# Patient Record
Sex: Female | Born: 1950 | Race: Black or African American | Hispanic: No | Marital: Single | Smoking: Never smoker
Health system: Southern US, Community
[De-identification: ages and names within clinical notes are randomized; demographics above are authoritative.]

## PROBLEM LIST (undated history)

## (undated) DIAGNOSIS — M1990 Unspecified osteoarthritis, unspecified site: Secondary | ICD-10-CM

## (undated) DIAGNOSIS — I10 Essential (primary) hypertension: Secondary | ICD-10-CM

## (undated) DIAGNOSIS — J45909 Unspecified asthma, uncomplicated: Secondary | ICD-10-CM

## (undated) HISTORY — PX: REPLACEMENT TOTAL KNEE: SUR1224

## (undated) HISTORY — PX: ABDOMINAL HYSTERECTOMY: SHX81

---

## 2001-03-08 ENCOUNTER — Inpatient Hospital Stay (HOSPITAL_COMMUNITY): Admission: EM | Admit: 2001-03-08 | Discharge: 2001-03-09 | Payer: Self-pay | Admitting: Internal Medicine

## 2001-03-08 ENCOUNTER — Encounter: Payer: Self-pay | Admitting: Internal Medicine

## 2002-06-18 ENCOUNTER — Other Ambulatory Visit: Admission: RE | Admit: 2002-06-18 | Discharge: 2002-06-18 | Payer: Self-pay | Admitting: *Deleted

## 2002-06-25 ENCOUNTER — Encounter: Admission: RE | Admit: 2002-06-25 | Discharge: 2002-07-22 | Payer: Self-pay | Admitting: Internal Medicine

## 2005-01-29 ENCOUNTER — Ambulatory Visit (HOSPITAL_BASED_OUTPATIENT_CLINIC_OR_DEPARTMENT_OTHER): Admission: RE | Admit: 2005-01-29 | Discharge: 2005-01-29 | Payer: Self-pay | Admitting: Cardiology

## 2005-01-30 ENCOUNTER — Emergency Department (HOSPITAL_COMMUNITY): Admission: EM | Admit: 2005-01-30 | Discharge: 2005-01-30 | Payer: Self-pay | Admitting: Emergency Medicine

## 2005-02-05 ENCOUNTER — Ambulatory Visit: Payer: Self-pay | Admitting: Internal Medicine

## 2008-02-05 ENCOUNTER — Other Ambulatory Visit: Admission: RE | Admit: 2008-02-05 | Discharge: 2008-02-05 | Payer: Self-pay | Admitting: Obstetrics and Gynecology

## 2008-02-05 ENCOUNTER — Ambulatory Visit: Payer: Self-pay | Admitting: Obstetrics & Gynecology

## 2008-02-28 ENCOUNTER — Ambulatory Visit: Payer: Self-pay | Admitting: Gynecology

## 2010-05-12 ENCOUNTER — Emergency Department (HOSPITAL_BASED_OUTPATIENT_CLINIC_OR_DEPARTMENT_OTHER)
Admission: EM | Admit: 2010-05-12 | Discharge: 2010-05-12 | Payer: Self-pay | Source: Home / Self Care | Admitting: Emergency Medicine

## 2010-05-12 ENCOUNTER — Ambulatory Visit: Payer: Self-pay | Admitting: Diagnostic Radiology

## 2010-07-08 ENCOUNTER — Encounter (HOSPITAL_COMMUNITY)
Admission: RE | Admit: 2010-07-08 | Discharge: 2010-10-06 | Payer: Self-pay | Source: Home / Self Care | Attending: Cardiology | Admitting: Cardiology

## 2010-10-30 ENCOUNTER — Encounter: Payer: Self-pay | Admitting: Cardiology

## 2010-12-23 LAB — COMPREHENSIVE METABOLIC PANEL
BUN: 20 mg/dL (ref 6–23)
CO2: 29 mEq/L (ref 19–32)
Calcium: 9.4 mg/dL (ref 8.4–10.5)
Creatinine, Ser: 1 mg/dL (ref 0.4–1.2)
GFR calc non Af Amer: 57 mL/min — ABNORMAL LOW (ref >60)
Glucose, Bld: 102 mg/dL — ABNORMAL HIGH (ref 70–99)
Total Protein: 8.7 g/dL — ABNORMAL HIGH (ref 6.0–8.3)

## 2010-12-23 LAB — URINALYSIS, ROUTINE W REFLEX MICROSCOPIC
Bilirubin Urine: NEGATIVE
Glucose, UA: NEGATIVE mg/dL
Ketones, ur: NEGATIVE mg/dL
Protein, ur: NEGATIVE mg/dL
pH: 6.5 (ref 5.0–8.0)

## 2010-12-23 LAB — DIFFERENTIAL
Eosinophils Absolute: 0.3 10*3/uL (ref 0.0–0.7)
Lymphocytes Relative: 49 % — ABNORMAL HIGH (ref 12–46)
Lymphs Abs: 2.8 10*3/uL (ref 0.7–4.0)
Monocytes Relative: 6 % (ref 3–12)
Neutro Abs: 2.2 10*3/uL (ref 1.7–7.7)
Neutrophils Relative %: 39 % — ABNORMAL LOW (ref 43–77)

## 2010-12-23 LAB — POCT CARDIAC MARKERS
CKMB, poc: 1.9 ng/mL (ref 1.0–8.0)
CKMB, poc: 3.6 ng/mL (ref 1.0–8.0)
Myoglobin, poc: 86.4 ng/mL (ref 12–200)

## 2010-12-23 LAB — CBC
HCT: 39 % (ref 36.0–46.0)
Hemoglobin: 13 g/dL (ref 12.0–15.0)
MCH: 32.2 pg (ref 26.0–34.0)
MCHC: 33.5 g/dL (ref 30.0–36.0)
RDW: 12.7 % (ref 11.5–15.5)

## 2010-12-23 LAB — URINE CULTURE: Culture  Setup Time: 201108050113

## 2011-02-21 NOTE — Group Therapy Note (Signed)
NAMEMarland Kitchen  AUTRY, DROEGE NO.:  [id number]   MEDICAL RECORD NO.:  [id number]          PATIENT TYPE:  WOC   LOCATION:  [facility]                   FACILITY:  [facility]   PHYSICIAN:  Ginger Carne, MD DATE OF BIRTH:  01-Aug-1951   DATE OF SERVICE:  02/28/2008                                  CLINIC NOTE   Alejandra Hughes returns today for results of endometrial biopsy because of  postmenopausal bleeding.  Biopsy results from February 05, 2008, reveals  scant benign columnar mucosa without neoplasia or hyperplasia.  The  patient at this time states that she does have low back pain with  occasional spotting.  Her last ultrasound report noted was on December 16, 2007, from [facility].  Unfortunately, it is  nonspecific as to the location of the largest fibroid measuring 5.4 cm  and also describes an enlarged ovary without any dimensions listed.  Her  primary care physician in [facility] is Dr. Madison Hickman.  The patient  is in agreement that she will contact Dr. Daphine Deutscher for a followup  ultrasound at [facility] and then proceed from there as far as  further management.  I explained to the patient that she may require a  hysteroscopy, dilation and curettage, and also depending on the location  of the fibroids may be responsible for some of her low back pain.  She  weighs 255 pounds and is 5 feet 4-1/2 inches, and certainly, her pain  may be completely unrelated to her leiomyoma.  The patient will follow  up with Dr. Daphine Deutscher in [facility].           ______________________________  Ginger Carne, MD     SHB/MEDQ  D:  02/28/2008  T:  02/29/2008  Job:  [id number]
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
## 2011-02-24 NOTE — Discharge Summary (Signed)
[facility]  Patient:    Alejandra Hughes                          MRN: [id number] Adm. Date:  [id number] Disc. Date: 03/09/01 Attending:  Dorena Cookey                           Discharge Summary  HISTORY OF PRESENT ILLNESS:  Alejandra Hughes is a 60 year old black female who presented at primary care office yesterday with jitteriness after eating, sweating, and some chest discomfort she described as midsternal sharp pain.  She said that it occurred while she was vacuuming.  She took an aspirin.  She felt better but had two episodes of similar chest discomfort lasting 10-15 minutes on the day of admission.  She was evaluated in the office by Dr. Baldo Daub, who felt that her history of hypertension warranted a rule-out protocol.  Risk factors include hypertension and family history of heart disease in mother at advanced age.  Minor risk factors include obesity and sedentary lifestyle.  HOSPITAL COURSE:  The patient was admitted on rule-out protocol.  Serial enzymes were negative.  EKG was obtained on admission and repeated on the morning of discharge, showing normal sinus rhythm with borderline first-degree AV block and nonspecific ST and T-wave abnormalities.  Specifically, the patient has flipped Ts in III and V1, V2, V3, and V4 consistent with her obesity.  IMPRESSION: 1. Hypertension, moderately well controlled. 2. Obesity. 3. Atypical chest pain.  MEDICATIONS:  Will begin Celebrex 200 mg p.o. q.d. and Protonix 40 mg p.o. q.d., in addition to her preadmission medications.  FOLLOW-UP:  She will follow up with Dr. Baldo Daub in one week. DD:  03/09/01 TD:  03/09/01 Job: [id number]

## 2011-02-24 NOTE — Procedures (Signed)
NAMEMAKENLY, LARABEE NO.:  [id number]   MEDICAL RECORD NO.:  [id number]          PATIENT TYPE:  OUT   LOCATION:  SLEEP CENTER                 FACILITY:  [facility]   PHYSICIAN:  Clinton D. Maple Hudson, M.D. DATE OF BIRTH:  Feb 25, 1951   DATE OF STUDY:  01/29/2005                              NOCTURNAL POLYSOMNOGRAM   STUDY DATE:  January 29, 2005   REFERRING PHYSICIAN:  Dr. Kevin Fenton Spruill   INDICATION FOR STUDY:  Hypersomnia with sleep apnea.  Epworth Sleepiness  Score 22/24, BMI 38, weight 230 pounds.   SLEEP ARCHITECTURE:  Total sleep time 300 minutes with sleep efficiency 71%.  Stage I was 9%, stage II 58%, stages III and IV 11%, REM was 22% of total  sleep time.  Sleep latency 93 minutes, REM latency 38 minutes, awake after  sleep onset 29 minutes, arousal index increased at 33.  Sleep onset was at  1:09 a.m.   RESPIRATORY DATA:  Respiratory disturbance index (RDI, AHI) 78.3 obstructive  events per hour indicating severe obstructive sleep apnea/hypopnea syndrome.  This included 210 obstructive apneas and 182 hypopneas.  Most sleep and most  events were while supine.  REM RDI 119.  Because of late sleep onset there  was insufficient residual time by protocol to permit CPAP titration on this  study night after diagnostic criteria were met.   OXYGEN DATA:  Mild snoring with oxygen desaturation to a nadir of 72%.  Mean  oxygen saturation through the study was 93% on room air.   CARDIAC DATA:  Normal sinus rhythm.   MOVEMENT/PARASOMNIA:  Occasional leg jerk with little effect on sleep.   IMPRESSION/RECOMMENDATION:  1.  Severe obstructive sleep apnea/hypopnea syndrome, respiratory      disturbance index 78.3 per hour with oxygen desaturation to 72% and mild      snoring.  2.  Consider return for continuous positive airway pressure titration or      evaluate for alternative therapies as      appropriate.  Suggest on return bringing a sleep medication to aid with  initiation and maintenance of sleep during the study.      CDY/MEDQ  D:  02/05/2005 12:25:04  T:  02/05/2005 16:10:96  Job:  [id number]

## 2011-08-10 ENCOUNTER — Emergency Department (HOSPITAL_BASED_OUTPATIENT_CLINIC_OR_DEPARTMENT_OTHER)
Admission: EM | Admit: 2011-08-10 | Discharge: 2011-08-10 | Disposition: A | Discharge status: Home / Self Care | Payer: Medicare Other | Attending: Emergency Medicine | Admitting: Emergency Medicine

## 2011-08-10 ENCOUNTER — Encounter: Payer: Self-pay | Admitting: *Deleted

## 2011-08-10 DIAGNOSIS — E119 Type 2 diabetes mellitus without complications: Secondary | ICD-10-CM | POA: Insufficient documentation

## 2011-08-10 DIAGNOSIS — Z79899 Other long term (current) drug therapy: Secondary | ICD-10-CM | POA: Insufficient documentation

## 2011-08-10 DIAGNOSIS — L02811 Cutaneous abscess of head [any part, except face]: Secondary | ICD-10-CM

## 2011-08-10 DIAGNOSIS — L02818 Cutaneous abscess of other sites: Secondary | ICD-10-CM | POA: Insufficient documentation

## 2011-08-10 DIAGNOSIS — I10 Essential (primary) hypertension: Secondary | ICD-10-CM | POA: Insufficient documentation

## 2011-08-10 DIAGNOSIS — L03818 Cellulitis of other sites: Secondary | ICD-10-CM | POA: Insufficient documentation

## 2011-08-10 HISTORY — DX: Essential (primary) hypertension: I10

## 2011-08-10 MED ORDER — HYDROCODONE-ACETAMINOPHEN 5-325 MG PO TABS: 1.0000 | ORAL_TABLET | Freq: Four times a day (QID) | ORAL | Status: AC | PRN

## 2011-08-10 MED ORDER — DOXYCYCLINE HYCLATE 100 MG PO CAPS: 100.0000 mg | ORAL_CAPSULE | Freq: Two times a day (BID) | ORAL | Status: AC

## 2011-08-10 NOTE — ED Provider Notes (Signed)
History     CSN: [id number] Arrival date & time: 08/10/2011  8:33 AM   First MD Initiated Contact with Patient 08/10/11 0820      Chief Complaint  Patient presents with  . Rash    (Consider location/radiation/quality/duration/timing/severity/associated sxs/prior treatment) Patient is a 59 y.o. female presenting with rash.  Rash    patient presenting for infection to left temporal area scalp. She was seen by her dermatologist approximately a week ago for what sounds like a folliculitis of the scalp and treated with a cream. This has helped everything but a sore area started developing a large bump in the left temple appeared. She's it was draining some purulent material in the past 2 days. Pain is 10 out of 10. Nonradiating. Described as sharp tenderness. No fever. No significant headache other than the localized pain. No nausea no vomiting.  Past Medical History  Diagnosis Date  . Hypertension   . Diabetes mellitus     Past Surgical History  Procedure Date  . Cesarean section   . Abdominal hysterectomy     No family history on file.  History  Substance Use Topics  . Smoking status: Never Smoker   . Smokeless tobacco: Not on file  . Alcohol Use: No    OB History    Grav Para Term Preterm Abortions TAB SAB Ect Mult Living                  Review of Systems  Constitutional: Negative for fever.  HENT: Negative for ear pain, congestion and neck pain.   Eyes: Negative for pain and visual disturbance.  Respiratory: Negative for cough and shortness of breath.   Cardiovascular: Negative for chest pain.  Gastrointestinal: Negative for nausea, vomiting, abdominal pain and diarrhea.  Genitourinary: Negative for dysuria and hematuria.  Musculoskeletal: Negative for back pain.  Skin: Positive for rash.  Neurological: Negative for headaches.  Hematological: Negative for adenopathy. Does not bruise/bleed easily.    Allergies  Review of patient's allergies indicates no  known allergies.  Home Medications   Current Outpatient Rx  Name Route Sig Dispense Refill  . ALBUTEROL SULFATE HFA 108 (90 BASE) MCG/ACT IN AERS Inhalation Inhale 2 puffs into the lungs every 6 (six) hours as needed.      . NORVASC PO Oral Take by mouth.      Marland Kitchen BENAZEPRIL HCL PO Oral Take by mouth.      . METFORMIN HCL PO Oral Take by mouth.      . METOPROLOL TARTRATE PO Oral Take by mouth.      Marland Kitchen SINGULAIR PO Oral Take by mouth.      . NABUMETONE PO Oral Take by mouth.      . CRESTOR PO Oral Take by mouth.      . TRIAMTERENE PO Oral Take by mouth.      . DOXYCYCLINE HYCLATE 100 MG PO CAPS Oral Take 1 capsule (100 mg total) by mouth 2 (two) times daily. 14 capsule 0  . HYDROCODONE-ACETAMINOPHEN 5-325 MG PO TABS Oral Take 1-2 tablets by mouth every 6 (six) hours as needed for pain. 10 tablet 0    BP 128/90  Pulse 87  Temp(Src) 98.9 F (37.2 C) (Oral)  Resp 20  SpO2 98%  Physical Exam  Nursing note and vitals reviewed. Constitutional: She appears well-developed and well-nourished.  HENT:  Head: Normocephalic.  Mouth/Throat: Oropharynx is clear and moist.       Left temporal area of the scalp with  a 3 cm x 3 cm area of induration no fluctuance positive tenderness some crusting in the central area no purulent discharge.  Eyes: Conjunctivae and EOM are normal. Pupils are equal, round, and reactive to light.  Neck: Normal range of motion. Neck supple.  Cardiovascular: Normal rate, regular rhythm, normal heart sounds and intact distal pulses.   No murmur heard. Pulmonary/Chest: Effort normal and breath sounds normal.  Abdominal: Soft. Bowel sounds are normal. There is no tenderness.  Musculoskeletal: Normal range of motion. She exhibits no edema and no tenderness.  Neurological: She is alert. No cranial nerve deficit. She exhibits normal muscle tone. Coordination normal.  Skin: Skin is warm. There is erythema.       CT head exam.    ED Course  Procedures (including critical  care time)  Labs Reviewed - No data to display No results found.   1. Scalp abscess       MDM  Patient with left temporal area scalp abscess no fluctuance currently area of induration is about 3 cm. Not ready for I&D at this time. May be amendable to antibiotic therapy only. Will start on doxycycline to cover MRSA.         Shelda Jakes, MD 08/10/11 0900

## 2011-08-10 NOTE — ED Notes (Signed)
Switched shampoo 2 weeks ago and had small bumps to her scalp afterward. Saw a dermatologist and was given cream to use. It did help some but she has 2 new ones that will not heal. Painful.

## 2011-08-24 ENCOUNTER — Other Ambulatory Visit: Payer: Self-pay | Admitting: Orthopedic Surgery

## 2011-08-24 DIAGNOSIS — M25562 Pain in left knee: Secondary | ICD-10-CM

## 2011-09-04 ENCOUNTER — Other Ambulatory Visit: Payer: Medicare Other

## 2011-10-29 ENCOUNTER — Emergency Department (HOSPITAL_BASED_OUTPATIENT_CLINIC_OR_DEPARTMENT_OTHER)
Admission: EM | Admit: 2011-10-29 | Discharge: 2011-10-29 | Disposition: A | Discharge status: Home / Self Care | Payer: Medicare Other | Attending: Emergency Medicine | Admitting: Emergency Medicine

## 2011-10-29 ENCOUNTER — Emergency Department (INDEPENDENT_AMBULATORY_CARE_PROVIDER_SITE_OTHER): Payer: Medicare Other

## 2011-10-29 ENCOUNTER — Encounter (HOSPITAL_BASED_OUTPATIENT_CLINIC_OR_DEPARTMENT_OTHER): Payer: Self-pay | Admitting: *Deleted

## 2011-10-29 DIAGNOSIS — R0602 Shortness of breath: Secondary | ICD-10-CM | POA: Insufficient documentation

## 2011-10-29 DIAGNOSIS — Z79899 Other long term (current) drug therapy: Secondary | ICD-10-CM | POA: Insufficient documentation

## 2011-10-29 DIAGNOSIS — I10 Essential (primary) hypertension: Secondary | ICD-10-CM | POA: Insufficient documentation

## 2011-10-29 DIAGNOSIS — R05 Cough: Secondary | ICD-10-CM

## 2011-10-29 DIAGNOSIS — J40 Bronchitis, not specified as acute or chronic: Secondary | ICD-10-CM | POA: Insufficient documentation

## 2011-10-29 DIAGNOSIS — E119 Type 2 diabetes mellitus without complications: Secondary | ICD-10-CM | POA: Insufficient documentation

## 2011-10-29 MED ORDER — AZITHROMYCIN 250 MG PO TABS: 250.0000 mg | ORAL_TABLET | Freq: Every day | ORAL | Status: AC

## 2011-10-29 NOTE — ED Provider Notes (Signed)
History/physical exam/procedure(s) were performed by non-physician practitioner and as supervising physician I was immediately available for consultation/collaboration. I have reviewed all notes and am in agreement with care and plan.   Cristyn Crossno S Corley Maffeo, MD 10/29/11 2257 

## 2011-10-29 NOTE — ED Notes (Signed)
Pt states she has had a cough since last Monday. Productive with yellow, white sputum

## 2011-10-29 NOTE — ED Provider Notes (Signed)
History     CSN: [id number]  Arrival date & time 10/29/11  1612   First MD Initiated Contact with Patient 10/29/11 2002      Chief Complaint  Patient presents with  . Cough    (Consider location/radiation/quality/duration/timing/severity/associated sxs/prior treatment) Patient is a 61 y.o. female presenting with cough. The history is provided by the patient. No language interpreter was used.  Cough This is a new problem. The current episode started more than 1 week ago. The problem occurs constantly. The problem has been gradually worsening. The cough is productive of sputum. There has been no fever. Associated symptoms include shortness of breath. Pertinent negatives include no chest pain. She has tried nothing for the symptoms. She is not a smoker. Her past medical history is significant for pneumonia. Her past medical history does not include asthma.    Past Medical History  Diagnosis Date  . Hypertension   . Diabetes mellitus     Past Surgical History  Procedure Date  . Cesarean section   . Abdominal hysterectomy     History reviewed. No pertinent family history.  History  Substance Use Topics  . Smoking status: Never Smoker   . Smokeless tobacco: Not on file  . Alcohol Use: No    OB History    Grav Para Term Preterm Abortions TAB SAB Ect Mult Living                  Review of Systems  Respiratory: Positive for cough and shortness of breath.   Cardiovascular: Negative for chest pain.  All other systems reviewed and are negative.    Allergies  Review of patient's allergies indicates no known allergies.  Home Medications   Current Outpatient Rx  Name Route Sig Dispense Refill  . ALBUTEROL SULFATE HFA 108 (90 BASE) MCG/ACT IN AERS Inhalation Inhale 2 puffs into the lungs every 6 (six) hours as needed. For shortness of breath and wheezing    . AMLODIPINE BESYLATE 5 MG PO TABS Oral Take 5 mg by mouth daily.    Marland Kitchen BENAZEPRIL HCL 40 MG PO TABS Oral Take 40  mg by mouth daily.    . CHLORPHENIRAMINE-ACETAMINOPHEN 2-325 MG PO TABS Oral Take 2 tablets by mouth every 6 (six) hours as needed. For congestion    . ESCITALOPRAM OXALATE 10 MG PO TABS Oral Take 10 mg by mouth daily.    Marland Kitchen FERROUS SULFATE 325 (65 FE) MG PO TABS Oral Take 325 mg by mouth daily.    Marland Kitchen FLUTICASONE PROPIONATE 50 MCG/ACT NA SUSP Nasal Place 2 sprays into the nose daily.    Marland Kitchen LORATADINE 10 MG PO TABS Oral Take 10 mg by mouth daily.    Marland Kitchen METFORMIN HCL 500 MG PO TABS Oral Take 500 mg by mouth 2 (two) times daily with a meal.    . ADULT MULTIVITAMIN W/MINERALS CH Oral Take 1 tablet by mouth daily.    Marland Kitchen NABUMETONE 500 MG PO TABS Oral Take 500 mg by mouth 2 (two) times daily.    Marland Kitchen OVER THE COUNTER MEDICATION Oral Take 1 Package by mouth daily. Detox Vitamin Pack    . ROSUVASTATIN CALCIUM 20 MG PO TABS Oral Take 20 mg by mouth daily.    . TRIAMTERENE-HCTZ 75-50 MG PO TABS Oral Take 1 tablet by mouth daily.      BP 123/78  Pulse 89  Temp(Src) 98.9 F (37.2 C) (Oral)  Resp 18  Ht 5\' 4"  (1.626 m)  Wt 250 lb (  113.399 kg)  BMI 42.91 kg/m2  SpO2 96%  Physical Exam  Nursing note and vitals reviewed. Constitutional: She appears well-developed.  HENT:  Head: Normocephalic.  Eyes: Conjunctivae are normal. Pupils are equal, round, and reactive to light.  Neck: Normal range of motion.  Cardiovascular: Normal rate.   Pulmonary/Chest: Effort normal. She has no wheezes.  Abdominal: Soft.  Musculoskeletal: Normal range of motion.  Neurological: She is alert.  Skin: Skin is warm.    ED Course  Procedures (including critical care time)  Labs Reviewed - No data to display Dg Chest 2 View  10/29/2011  *RADIOLOGY REPORT*  Clinical Data: Cough.  CHEST - 2 VIEW  Comparison: 05/12/2010 and 01/30/2005.  Findings: The heart size and mediastinal contours are stable.  The lungs are clear.  I believe there is mild chronic central airway thickening which appears unchanged.  No significant pleural  effusion is demonstrated.  Osseous structures are stable with thoracic spine degenerative changes.  IMPRESSION: Stable examination with mild chronic central airway thickening.  No acute cardiopulmonary process.  Original Report Authenticated By: Gerrianne Scale, M.D.     No diagnosis found.    MDM  Pt given rx for zithromax.  I advised follow up with Dr. Daphine Deutscher for recheck.       Langston Masker, [place] 10/29/11 2020

## 2016-04-26 ENCOUNTER — Ambulatory Visit (INDEPENDENT_AMBULATORY_CARE_PROVIDER_SITE_OTHER): Payer: Medicare HMO | Admitting: Podiatry

## 2016-04-26 ENCOUNTER — Encounter: Payer: Self-pay | Admitting: Podiatry

## 2016-04-26 VITALS — BP 127/85 | HR 107

## 2016-04-26 DIAGNOSIS — M216X1 Other acquired deformities of right foot: Secondary | ICD-10-CM

## 2016-04-26 DIAGNOSIS — M216X2 Other acquired deformities of left foot: Secondary | ICD-10-CM | POA: Diagnosis not present

## 2016-04-26 DIAGNOSIS — E0800 Diabetes mellitus due to underlying condition with hyperosmolarity without nonketotic hyperglycemic-hyperosmolar coma (NKHHC): Secondary | ICD-10-CM | POA: Diagnosis not present

## 2016-04-26 DIAGNOSIS — M21969 Unspecified acquired deformity of unspecified lower leg: Secondary | ICD-10-CM | POA: Diagnosis not present

## 2016-04-26 DIAGNOSIS — E119 Type 2 diabetes mellitus without complications: Secondary | ICD-10-CM | POA: Insufficient documentation

## 2016-04-26 NOTE — Progress Notes (Signed)
SUBJECTIVE: 65 y.o. year old female presents for diabetic foot care. Feet gets tired and have difficulty findings shoes.  Been diabetic for 4 years. Last A1c was 7 in February 2017. Having knee problem and scheduled for left knee surgery in June 06 2016.   REVIEW OF SYSTEMS: A comprehensive review of systems was negative except for: Arthritic knees, Diabetes.   OBJECTIVE: DERMATOLOGIC EXAMINATION: Nails: All nails are thick and hypertrophic.  VASCULAR EXAMINATION OF LOWER LIMBS: Pedal pulses: All pedal pulses are palpable with normal pulsation.  No edema or erythema noted. Temperature gradient from tibial crest to dorsum of foot is within normal bilateral.  NEUROLOGIC EXAMINATION OF THE LOWER LIMBS: Achilles DTR is present and within normal. Monofilament (Semmes-Weinstein 10-gm) sensory testing positive 6 out of 6, bilateral. Vibratory sensations(128Hz  turning fork) intact at medial and lateral forefoot bilateral.  Sharp and Dull discriminatory sensations at the plantar ball of hallux is intact bilateral.   MUSCULOSKELETAL EXAMINATION: Positive for hypermobile first ray. Short first metatarsal bilateral.  Excess subtalar joint pronation with weight bearing.   ASSESSMENT: Diabetes under control Metatarsal deformity. STJ hyperpronation. Pain in lower limb.  PLAN: Reviewed findings and available treatment options. May benefit from diabetic shoes.

## 2016-04-26 NOTE — Patient Instructions (Signed)
Seen for diabetic foot care. May benefit from diabetic shoes.

## 2017-01-24 ENCOUNTER — Encounter (HOSPITAL_BASED_OUTPATIENT_CLINIC_OR_DEPARTMENT_OTHER): Payer: Self-pay | Admitting: Emergency Medicine

## 2017-01-24 ENCOUNTER — Emergency Department (HOSPITAL_BASED_OUTPATIENT_CLINIC_OR_DEPARTMENT_OTHER)
Admission: EM | Admit: 2017-01-24 | Discharge: 2017-01-24 | Disposition: A | Discharge status: Home / Self Care | Payer: Medicare HMO | Attending: Emergency Medicine | Admitting: Emergency Medicine

## 2017-01-24 ENCOUNTER — Emergency Department (HOSPITAL_BASED_OUTPATIENT_CLINIC_OR_DEPARTMENT_OTHER): Payer: Medicare HMO

## 2017-01-24 DIAGNOSIS — Z79899 Other long term (current) drug therapy: Secondary | ICD-10-CM | POA: Diagnosis not present

## 2017-01-24 DIAGNOSIS — M25551 Pain in right hip: Secondary | ICD-10-CM | POA: Insufficient documentation

## 2017-01-24 DIAGNOSIS — J45909 Unspecified asthma, uncomplicated: Secondary | ICD-10-CM | POA: Diagnosis not present

## 2017-01-24 DIAGNOSIS — M7918 Myalgia, other site: Secondary | ICD-10-CM

## 2017-01-24 DIAGNOSIS — I10 Essential (primary) hypertension: Secondary | ICD-10-CM | POA: Insufficient documentation

## 2017-01-24 DIAGNOSIS — E119 Type 2 diabetes mellitus without complications: Secondary | ICD-10-CM | POA: Diagnosis not present

## 2017-01-24 DIAGNOSIS — Z7984 Long term (current) use of oral hypoglycemic drugs: Secondary | ICD-10-CM | POA: Insufficient documentation

## 2017-01-24 DIAGNOSIS — M79604 Pain in right leg: Secondary | ICD-10-CM | POA: Diagnosis not present

## 2017-01-24 HISTORY — DX: Unspecified osteoarthritis, unspecified site: M19.90

## 2017-01-24 HISTORY — DX: Unspecified asthma, uncomplicated: J45.909

## 2017-01-24 MED ORDER — KETOROLAC TROMETHAMINE 30 MG/ML IJ SOLN
30.0000 mg | Freq: Once | INTRAMUSCULAR | Status: AC
  Administered 2017-01-24: 30 mg via INTRAMUSCULAR
  Filled 2017-01-24: qty 1

## 2017-01-24 NOTE — ED Provider Notes (Signed)
[facility] Provider Note   CSN: [id number] Arrival date & time: 01/24/17  0844     History   Chief Complaint Chief Complaint  Patient presents with  . Leg Pain    HPI TANIAYA RUDDER is a 66 y.o. female.  HPI   66 year old female presents today with right leg pain.  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  She reports that over the last 2 days she started to develop pain in her right hip and posterior leg.  She notes tenderness to palpation of the hamstrings and gastrocnemius.  She also notes sharp pain in her hip with certain movements.  She describes a vague discomfort in her lower back, it is worsened with forward flexion.  She denies any red flags for back pain, sensation strength and motor function are intact.  No recent trauma, no infectious etiology.  History of blood clots, no shortness of breath, no lower extremity swelling or edema.   Past Medical History:  Diagnosis Date  . Arthritis   . Asthma   . Diabetes mellitus   . Hypertension     Patient Active Problem List   Diagnosis Date Noted  . Diabetes mellitus (HCC) 04/26/2016    Past Surgical History:  Procedure Laterality Date  . ABDOMINAL HYSTERECTOMY    . CESAREAN SECTION    . REPLACEMENT TOTAL KNEE      OB History    No data available       Home Medications    Prior to Admission medications   Medication Sig Start Date End Date Taking? Authorizing Provider  albuterol (PROAIR HFA) 108 (90 Base) MCG/ACT inhaler Inhale into the lungs.   Yes Historical Provider, MD  DULoxetine (CYMBALTA) 60 MG capsule Take 60 mg by mouth. 01/24/16 01/24/17 Yes Historical Provider, MD  Exenatide ER (BYDUREON) 2 MG PEN Inject 2 mg into the skin. 01/24/16 01/24/17 Yes Historical Provider, MD  amLODipine (NORVASC) 5 MG tablet Take 5 mg by mouth daily.    Historical Provider, MD  benazepril-hydrochlorthiazide (LOTENSIN HCT) 10-12.5 MG tablet Take by mouth.    Historical  Provider, MD  Chlorpheniramine-APAP (CORICIDIN) 2-325 MG TABS Take 2 tablets by mouth every 6 (six) hours as needed. For congestion    Historical Provider, MD  fluticasone (FLONASE) 50 MCG/ACT nasal spray Place into the nose.    Historical Provider, MD  loratadine (CLARITIN) 10 MG tablet Take by mouth.    Historical Provider, MD  metFORMIN (GLUCOPHAGE) 1000 MG tablet Take 1,000 mg by mouth. 01/24/16 01/23/17  Historical Provider, MD  metoprolol succinate (TOPROL-XL) 50 MG 24 hr tablet Take 50 mg by mouth. 07/03/15   Historical Provider, MD  nabumetone (RELAFEN) 500 MG tablet Take 500 mg by mouth.    Historical Provider, MD  omeprazole (PRILOSEC) 40 MG capsule Take 40 mg by mouth. 01/24/16   Historical Provider, MD  pioglitazone (ACTOS) 15 MG tablet Take 15 mg by mouth. 01/24/16 01/23/17  Historical Provider, MD  rosuvastatin (CRESTOR) 20 MG tablet Take 20 mg by mouth.    Historical Provider, MD  triamterene-hydrochlorothiazide (MAXZIDE) 75-50 MG per tablet Take 1 tablet by mouth daily.    Historical Provider, MD    Family History No family history on file.  Social History Social History  Substance Use Topics  . Smoking status: Never Smoker  . Smokeless tobacco: Never Used  . Alcohol use No     Allergies   Patient has no known  allergies.   Review of Systems Review of Systems  Respiratory: Negative for cough and shortness of breath.   Musculoskeletal: Positive for arthralgias and myalgias. Negative for joint swelling.  Neurological: Negative for weakness and numbness.  All other systems reviewed and are negative.   Physical Exam Updated Vital Signs There were no vitals taken for this visit.  Physical Exam  Constitutional: She is oriented to person, place, and time. She appears well-developed and well-nourished.  HENT:  Head: Normocephalic and atraumatic.  Eyes: Conjunctivae are normal. Pupils are equal, round, and reactive to light. Right eye exhibits no discharge. Left eye  exhibits no discharge. No scleral icterus.  Neck: Normal range of motion. No JVD present. No tracheal deviation present.  Pulmonary/Chest: Effort normal. No stridor.  Musculoskeletal:  Minor tenderness to palpation of the lumbar region diffusely, no point tenderness, no thoracic or cervical spinal tenderness.  Straight leg is negative, but with pain in the hip with flexion.  Tenderness to palpation of the right posterior hip.  No pain with internal/external rotation.  Joint compartments are soft, tenderness to palpation of the distal hamstrings and gastrocnemius no swelling or edema, no warmth to touch, sensation intact  Neurological: She is alert and oriented to person, place, and time. Coordination normal.  Psychiatric: She has a normal mood and affect. Her behavior is normal. Judgment and thought content normal.  Nursing note and vitals reviewed.    ED Treatments / Results  Labs (all labs ordered are listed, but only abnormal results are displayed) Labs Reviewed - No data to display  EKG  EKG Interpretation None       Radiology Dg Lumbar Spine Complete  Result Date: 01/24/2017 CLINICAL DATA:  Right-sided low back and hip pain for 3 days. EXAM: LUMBAR SPINE - COMPLETE 4+ VIEW COMPARISON:  None. FINDINGS: There is no evidence of lumbar spine fracture. Alignment is normal. Intervertebral disc spaces are maintained. IMPRESSION: Negative. Electronically Signed   By: Kennith Center M.D.   On: 01/24/2017 09:34   US Venous Img Lower Unilateral Right  Result Date: 01/24/2017 CLINICAL DATA:  Right hip pain radiating into the leg for 3 days. EXAM: RIGHT LOWER EXTREMITY VENOUS DOPPLER ULTRASOUND TECHNIQUE: Gray-scale sonography with graded compression, as well as color Doppler and duplex ultrasound were performed to evaluate the lower extremity deep venous systems from the level of the common femoral vein and including the common femoral, femoral, profunda femoral, popliteal and calf veins  including the posterior tibial, peroneal and gastrocnemius veins when visible. The superficial great saphenous vein was also interrogated. Spectral Doppler was utilized to evaluate flow at rest and with distal augmentation maneuvers in the common femoral, femoral and popliteal veins. COMPARISON:  None. FINDINGS: Contralateral Common Femoral Vein: Respiratory phasicity is normal and symmetric with the symptomatic side. No evidence of thrombus. Normal compressibility. Common Femoral Vein: No evidence of thrombus. Normal compressibility, respiratory phasicity and response to augmentation. Saphenofemoral Junction: No evidence of thrombus. Normal compressibility and flow on color Doppler imaging. Profunda Femoral Vein: No evidence of thrombus. Normal compressibility and flow on color Doppler imaging. Femoral Vein: No evidence of thrombus. Normal compressibility, respiratory phasicity and response to augmentation. Popliteal Vein: No evidence of thrombus. Normal compressibility, respiratory phasicity and response to augmentation. Calf Veins: No evidence of thrombus. Normal compressibility and flow on color Doppler imaging. Superficial Great Saphenous Vein: No evidence of thrombus. Normal compressibility and flow on color Doppler imaging. Venous Reflux:  None. Other Findings:  None. IMPRESSION: No evidence of  deep venous thrombosis. Electronically Signed   By: Drusilla Kanner M.D.   On: 01/24/2017 09:56   Dg Hip Unilat W Or Wo Pelvis 2-3 Views Right  Result Date: 01/24/2017 CLINICAL DATA:  Right side low back and hip pain for 3 days. EXAM: DG HIP (WITH OR WITHOUT PELVIS) 2-3V RIGHT COMPARISON:  None. FINDINGS: There is no evidence of hip fracture or dislocation. There is no evidence of arthropathy or other focal bone abnormality. IMPRESSION: Negative. Electronically Signed   By: Kennith Center M.D.   On: 01/24/2017 09:34    Procedures Procedures (including critical care time)  Medications Ordered in  ED Medications  ketorolac (TORADOL) 30 MG/ML injection 30 mg (30 mg Intramuscular Given 01/24/17 0956)     Initial Impression / Assessment and Plan / ED Course  I have reviewed the triage vital signs and the nursing notes.  Pertinent labs & imaging results that were available during my care of the patient were reviewed by me and considered in my medical decision making (see chart for details).     Final Clinical Impressions(s) / ED Diagnoses   Final diagnoses:  Pain of right hip joint  Musculoskeletal pain   Labs:   Imaging: DG lumbar, DG hip unilateral right, vascular ultrasound lower extremity right  Consults:  Therapeutics: Toradol  Discharge Meds:   Assessment/Plan: 66 year old female presents today with hip and leg pain.  Patient's pain is likely musculoskeletal.  She has no red flags for back pain.  She has no objective swelling to the lower extremity or reports of this.  She has a vascular ultrasound done here with no signs of DVT.  She has plain films of her lower back and hip.  Patient's symptoms improved with Toradol here in the ED.  She was instructed to decrease physical activity as this is probably overuse injury, follow-up with orthopedics in 1 week for reassessment if symptoms persist, ibuprofen or Tylenol as needed for discomfort, and strict return precautions.  Patient verbalized understanding and agreement to today's plan had no further questions or concerns    New Prescriptions New Prescriptions   No medications on file     Eyvonne Mechanic, PA-C 01/24/17 1033    Melene Plan, DO 01/24/17 1119

## 2017-01-24 NOTE — Discharge Instructions (Signed)
Please read attached information. If you experience any new or worsening signs or symptoms please return to the emergency room for evaluation. Please follow-up with your primary care provider or specialist as discussed.  Please use Tylenol or ibuprofen as needed for discomfort. °

## 2017-01-24 NOTE — ED Triage Notes (Signed)
Pt having right leg pain for two days.  Pt recently had left knee surgery.

## 2018-04-27 ENCOUNTER — Emergency Department (HOSPITAL_COMMUNITY)
Admission: EM | Admit: 2018-04-27 | Discharge: 2018-04-27 | Disposition: A | Discharge status: Home / Self Care | Payer: Medicare HMO | Attending: Emergency Medicine | Admitting: Emergency Medicine

## 2018-04-27 ENCOUNTER — Emergency Department (HOSPITAL_COMMUNITY): Payer: Medicare HMO

## 2018-04-27 ENCOUNTER — Encounter (HOSPITAL_COMMUNITY): Payer: Self-pay | Admitting: Radiology

## 2018-04-27 DIAGNOSIS — J45909 Unspecified asthma, uncomplicated: Secondary | ICD-10-CM | POA: Diagnosis not present

## 2018-04-27 DIAGNOSIS — Z79899 Other long term (current) drug therapy: Secondary | ICD-10-CM | POA: Insufficient documentation

## 2018-04-27 DIAGNOSIS — E119 Type 2 diabetes mellitus without complications: Secondary | ICD-10-CM | POA: Diagnosis not present

## 2018-04-27 DIAGNOSIS — T887XXA Unspecified adverse effect of drug or medicament, initial encounter: Secondary | ICD-10-CM | POA: Diagnosis not present

## 2018-04-27 DIAGNOSIS — I672 Cerebral atherosclerosis: Secondary | ICD-10-CM | POA: Insufficient documentation

## 2018-04-27 DIAGNOSIS — Z7982 Long term (current) use of aspirin: Secondary | ICD-10-CM | POA: Diagnosis not present

## 2018-04-27 DIAGNOSIS — I10 Essential (primary) hypertension: Secondary | ICD-10-CM | POA: Diagnosis not present

## 2018-04-27 DIAGNOSIS — Y69 Unspecified misadventure during surgical and medical care: Secondary | ICD-10-CM | POA: Insufficient documentation

## 2018-04-27 DIAGNOSIS — T451X5A Adverse effect of antineoplastic and immunosuppressive drugs, initial encounter: Secondary | ICD-10-CM | POA: Diagnosis not present

## 2018-04-27 DIAGNOSIS — R404 Transient alteration of awareness: Secondary | ICD-10-CM

## 2018-04-27 DIAGNOSIS — R4182 Altered mental status, unspecified: Secondary | ICD-10-CM | POA: Diagnosis present

## 2018-04-27 DIAGNOSIS — T50905A Adverse effect of unspecified drugs, medicaments and biological substances, initial encounter: Secondary | ICD-10-CM

## 2018-04-27 LAB — COMPREHENSIVE METABOLIC PANEL
ALBUMIN: 4.1 g/dL (ref 3.5–5.0)
ALT: 11 U/L (ref 0–44)
AST: 28 U/L (ref 15–41)
Alkaline Phosphatase: 47 U/L (ref 38–126)
Anion gap: 13 (ref 5–15)
BUN: 17 mg/dL (ref 8–23)
CO2: 23 mmol/L (ref 22–32)
CREATININE: 1.74 mg/dL — AB (ref 0.44–1.00)
Calcium: 9.5 mg/dL (ref 8.9–10.3)
Chloride: 107 mmol/L (ref 98–111)
GFR calc Af Amer: 34 mL/min — ABNORMAL LOW (ref >60)
GFR calc non Af Amer: 30 mL/min — ABNORMAL LOW (ref >60)
GLUCOSE: 132 mg/dL — AB (ref 70–99)
POTASSIUM: 4.2 mmol/L (ref 3.5–5.1)
Sodium: 143 mmol/L (ref 135–145)
Total Bilirubin: 1 mg/dL (ref 0.3–1.2)
Total Protein: 7 g/dL (ref 6.5–8.1)

## 2018-04-27 LAB — PROTIME-INR
INR: 0.93
Prothrombin Time: 12.4 seconds (ref 11.4–15.2)

## 2018-04-27 LAB — DIFFERENTIAL
BASOS ABS: 0 10*3/uL (ref 0.0–0.1)
BASOS PCT: 1 %
Eosinophils Absolute: 0.1 10*3/uL (ref 0.0–0.7)
Eosinophils Relative: 2 %
LYMPHS PCT: 26 %
Lymphs Abs: 1.1 10*3/uL (ref 0.7–4.0)
Monocytes Absolute: 0.4 10*3/uL (ref 0.1–1.0)
Monocytes Relative: 10 %
NEUTROS PCT: 61 %
Neutro Abs: 2.6 10*3/uL (ref 1.7–7.7)

## 2018-04-27 LAB — CBC
HEMATOCRIT: 37.1 % (ref 36.0–46.0)
HEMOGLOBIN: 11.5 g/dL — AB (ref 12.0–15.0)
MCH: 29 pg (ref 26.0–34.0)
MCHC: 31 g/dL (ref 30.0–36.0)
MCV: 93.7 fL (ref 78.0–100.0)
Platelets: 268 10*3/uL (ref 150–400)
RBC: 3.96 MIL/uL (ref 3.87–5.11)
RDW: 16.1 % — ABNORMAL HIGH (ref 11.5–15.5)
WBC: 4.2 10*3/uL (ref 4.0–10.5)

## 2018-04-27 LAB — I-STAT CHEM 8, ED
BUN: 23 mg/dL (ref 8–23)
Calcium, Ion: 1.11 mmol/L — ABNORMAL LOW (ref 1.15–1.40)
Chloride: 107 mmol/L (ref 98–111)
Creatinine, Ser: 1.6 mg/dL — ABNORMAL HIGH (ref 0.44–1.00)
Glucose, Bld: 130 mg/dL — ABNORMAL HIGH (ref 70–99)
HEMATOCRIT: 36 % (ref 36.0–46.0)
HEMOGLOBIN: 12.2 g/dL (ref 12.0–15.0)
Potassium: 4.1 mmol/L (ref 3.5–5.1)
Sodium: 143 mmol/L (ref 135–145)
TCO2: 26 mmol/L (ref 22–32)

## 2018-04-27 LAB — APTT: APTT: 21 s — AB (ref 24–36)

## 2018-04-27 LAB — I-STAT TROPONIN, ED: Troponin i, poc: 0 ng/mL (ref 0.00–0.08)

## 2018-04-27 LAB — CBG MONITORING, ED: GLUCOSE-CAPILLARY: 123 mg/dL — AB (ref 70–99)

## 2018-04-27 MED ORDER — SODIUM CHLORIDE 0.9 % IV SOLN
1000.0000 mL | INTRAVENOUS | Status: DC
  Administered 2018-04-27: 1000 mL via INTRAVENOUS

## 2018-04-27 MED ORDER — SODIUM CHLORIDE 0.9 % IV BOLUS (SEPSIS)
1000.0000 mL | Freq: Once | INTRAVENOUS | Status: AC
  Administered 2018-04-27: 1000 mL via INTRAVENOUS

## 2018-04-27 MED ORDER — IOPAMIDOL (ISOVUE-370) INJECTION 76%
INTRAVENOUS | Status: AC
  Administered 2018-04-27: 50 mL
  Filled 2018-04-27: qty 50

## 2018-04-27 MED ORDER — ACETAMINOPHEN 325 MG PO TABS
650.0000 mg | ORAL_TABLET | Freq: Once | ORAL | Status: AC
  Administered 2018-04-27: 650 mg via ORAL
  Filled 2018-04-27: qty 2

## 2018-04-27 NOTE — ED Triage Notes (Signed)
[facility] via ems; pt on new chemo med, which normally causes drowsiness, dizziness, and nausea for pt - increased today, pt having emesis as well; daughter also endorses pt had syncopal episode for about 1 minute; pt more altere

## 2018-04-27 NOTE — ED Provider Notes (Signed)
Medical screening examination/treatment/procedure(s) were conducted as a shared visit with non-physician practitioner(s) and myself.  I personally evaluated the patient during the encounter.  EKG Interpretation  Date/Time:  Saturday April 27 2018 14:32:37 EDT Ventricular Rate:  109 PR Interval:    QRS Duration: 78 QT Interval:  325 QTC Calculation: 438 R Axis:   2 Text Interpretation:  Sinus tachycardia Consider left ventricular hypertrophy Nonspecific T abnormalities, lateral leads No significant change was found Confirmed by Azalia Bilisampos, Kevin ([id number]) on 04/28/2018 10:41:21 PM  Patient had sudden onset of confusion and weakness with poor responsiveness.  He had been in the heat walking in a parking lot.  Patient is also recently started on a chemotherapeutic agent for cancer.  My evaluation after the patient has had significant provement in mental status, the patient is alert and appropriate.  She is answering questions without difficulty.  Heart is regular.  Lungs grossly clear.  Neurologic exam intact.  Skin is warm and dry.  Patient underwent CTA of the head and the neck with neurology consultation.  This is negative for stroke.  She continued to make improvement and return to baseline.  Suspected that the event was secondary to medication side effect.  Agree with plan of management. CRITICAL CARE Performed by: Arby BarretteMarcy Jered Heiny   Total critical care time:30 minutes  Critical care time was exclusive of separately billable procedures and treating other patients.  Critical care was necessary to treat or prevent imminent or life-threatening deterioration.  Critical care was time spent personally by me on the following activities: development of treatment plan with patient and/or surrogate as well as nursing, discussions with consultants, evaluation of patient's response to treatment, examination of patient, obtaining history from patient or surrogate, ordering and performing treatments and  interventions, ordering and review of laboratory studies, ordering and review of radiographic studies, pulse oximetry and re-evaluation of patient's condition.   Arby BarrettePfeiffer, Namiko Pritts, MD 04/28/18 2258

## 2018-04-27 NOTE — ED Notes (Signed)
Alejandra Hughes (Pt's daughter, [place]) : [phone]

## 2018-04-27 NOTE — ED Notes (Signed)
Ambulated pt in the hall. Limp and slight sway noted, pt and family stating no more than usual b/c pt walks with cane at home. Pt reports no dizziness, sob, or weakness.

## 2018-04-27 NOTE — ED Notes (Addendum)
Physician at bedside.

## 2018-04-27 NOTE — ED Notes (Addendum)
Pt states that she has not had her BP meds today; Provider made aware of that fact and upward trending BP; will continue to monitor

## 2018-04-27 NOTE — ED Notes (Signed)
[facility] contacted to activate Code Stroke 

## 2018-04-27 NOTE — ED Provider Notes (Signed)
[facility]   CSN: [id number] Arrival date & time: 04/27/18  1419   An emergency department physician performed an initial assessment on this suspected stroke patient at 1452.  History   Chief Complaint Chief Complaint  Patient presents with  . Code Stroke  . Altered Mental Status  . Loss of Consciousness    HPI Antoine Primaslla Z Smick is a 67 y.o. female.  The history is provided by the patient. No language interpreter was used.  Altered Mental Status   This is a new problem. Episode onset: 30 minutes ago. The problem has not changed since onset.Associated symptoms include confusion, unresponsiveness and weakness. Risk factors include a change in prescription. Her past medical history is significant for diabetes.  Loss of Consciousness   Associated symptoms include confusion and weakness.  Pt's family reports pt took her night time medications.  Pt was at a car lot began having trouble walking, standing,  Pt not talking.  Pt recently started on IBrance for Cancer.     Past Medical History:  Diagnosis Date  . Arthritis   . Asthma   . Diabetes mellitus   . Hypertension     Patient Active Problem List   Diagnosis Date Noted  . Diabetes mellitus (HCC) 04/26/2016    Past Surgical History:  Procedure Laterality Date  . ABDOMINAL HYSTERECTOMY    . CESAREAN SECTION    . REPLACEMENT TOTAL KNEE       OB History   None      Home Medications    Prior to Admission medications   Medication Sig Start Date End Date Taking? Authorizing Provider  albuterol (PROAIR HFA) 108 (90 Base) MCG/ACT inhaler Inhale 2 puffs into the lungs every 6 (six) hours as needed for wheezing or shortness of breath.    Yes [provider]  anastrozole (ARIMIDEX) 1 MG tablet Take 1 mg by mouth every morning.   Yes [provider]  aspirin EC 81 MG tablet Take 81 mg by mouth every evening.   Yes [provider]  baclofen  (LIORESAL) 10 MG tablet Take 10 mg by mouth See admin instructions. Take one tablet (10mg ) by mouth at bedtime, may take an additional two doses as needed for muscle spasms.   Yes [provider]  celecoxib (CELEBREX) 200 MG capsule Take 200 mg by mouth every morning.   Yes [provider]  docusate sodium (COLACE) 100 MG capsule Take 100 mg by mouth 2 (two) times daily.   Yes [provider]  doxycycline (VIBRA-TABS) 100 MG tablet Take 100 mg by mouth 2 (two) times daily.   Yes [provider]  Dulaglutide 1.5 MG/0.5ML SOPN Inject 1.5 mg into the skin every Friday.   Yes [provider]  DULoxetine (CYMBALTA) 60 MG capsule Take 60 mg by mouth every morning.  01/24/16 04/27/18 Yes [provider]  famciclovir (FAMVIR) 500 MG tablet Take 1,500 mg by mouth once as needed (at onset of blisters).   Yes [provider]  fluticasone (FLONASE) 50 MCG/ACT nasal spray Place 2 sprays into the nose at bedtime.    Yes [provider]  loratadine (CLARITIN) 10 MG tablet Take 10 mg by mouth at bedtime.    Yes [provider]  metFORMIN (GLUCOPHAGE) 1000 MG tablet Take 1,000 mg by mouth 2 (two) times daily with a meal.  01/24/16 04/27/18 Yes [provider]  metoprolol succinate (TOPROL-XL) 50 MG 24 hr tablet Take 50  mg by mouth every evening.  07/03/15  Yes [provider]  montelukast (SINGULAIR) 10 MG tablet Take 10 mg by mouth at bedtime.   Yes [provider]  omeprazole (PRILOSEC) 40 MG capsule Take 40 mg by mouth every morning.  01/24/16  Yes [provider]  palbociclib (IBRANCE) 125 MG capsule Take 125 mg by mouth See admin instructions. Take in the evening. Take whole with food. Take for 21 days on, 7 days off, repeat every 28 days.   Yes [provider]  potassium chloride SA (K-DUR,KLOR-CON) 20 MEQ tablet Take 20 mEq by mouth every morning.   Yes [provider]    prochlorperazine (COMPAZINE) 10 MG tablet Take 10 mg by mouth every 6 (six) hours as needed for nausea or vomiting.   Yes [provider]  ranitidine (ZANTAC) 300 MG tablet Take 300 mg by mouth at bedtime.   Yes [provider]  rosuvastatin (CRESTOR) 20 MG tablet Take 20 mg by mouth every evening.    Yes [provider]  triamcinolone cream (KENALOG) 0.1 % Apply 1 application topically See admin instructions. After radiotherapy   Yes [provider]    Family History No family history on file.  Social History Social History   Tobacco Use  . Smoking status: Never Smoker  . Smokeless tobacco: Never Used  Substance Use Topics  . Alcohol use: No  . Drug use: No     Allergies   Oxycodone and Bioflavonoids   Review of Systems Review of Systems  Unable to perform ROS: Mental status change  Cardiovascular: Positive for syncope.  Neurological: Positive for weakness.  Psychiatric/Behavioral: Positive for confusion.     Physical Exam Updated Vital Signs BP (!) 154/91 (BP Location: Right Arm)   Pulse 84   Temp 98.3 F (36.8 C)   Resp 19   Ht 5' 4.5" (1.638 m)   Wt 99.8 kg (220 lb)   SpO2 100%   BMI 37.18 kg/m   Physical Exam  Constitutional: She appears well-developed and well-nourished.  HENT:  Head: Normocephalic.  Right Ear: External ear normal.  Left Ear: External ear normal.  Mouth/Throat: Oropharynx is clear and moist.  Eyes: Pupils are equal, round, and reactive to light. EOM are normal.  Neck: Normal range of motion.  Cardiovascular: Normal rate, regular rhythm and normal heart sounds.  Pulmonary/Chest: Effort normal and breath sounds normal.  Abdominal: She exhibits no distension.  Musculoskeletal: Normal range of motion.  Neurological:  Pt does not answer questions,  Pt does not respond when attempting to do neurologic exam.  Pt not speaking,  Pt will not lift arms or legs.    Skin: Skin is warm.  Nursing note and  vitals reviewed.    ED Treatments / Results  Labs (all labs ordered are listed, but only abnormal results are displayed) Labs Reviewed  APTT - Abnormal; Notable for the following components:      Result Value   aPTT 21 (*)    All other components within normal limits  CBC - Abnormal; Notable for the following components:   Hemoglobin 11.5 (*)    RDW 16.1 (*)    All other components within normal limits  COMPREHENSIVE METABOLIC PANEL - Abnormal; Notable for the following components:   Glucose, Bld 132 (*)    Creatinine, Ser 1.74 (*)    GFR calc non Af Amer 30 (*)    GFR calc Af Amer 34 (*)    All other components  within normal limits  CBG MONITORING, ED - Abnormal; Notable for the following components:   Glucose-Capillary 123 (*)    All other components within normal limits  I-STAT CHEM 8, ED - Abnormal; Notable for the following components:   Creatinine, Ser 1.60 (*)    Glucose, Bld 130 (*)    Calcium, Ion 1.11 (*)    All other components within normal limits  PROTIME-INR  DIFFERENTIAL  I-STAT TROPONIN, ED  CBG MONITORING, ED    EKG None  Radiology Ct Angio Head W Or Wo Contrast  Result Date: 04/27/2018 CLINICAL DATA:  Altered mental status. Syncopal episode. Dysarthria and generalized weakness. History of breast cancer. EXAM: CT ANGIOGRAPHY HEAD AND NECK TECHNIQUE: Multidetector CT imaging of the head and neck was performed using the standard protocol during bolus administration of intravenous contrast. Multiplanar CT image reconstructions and MIPs were obtained to evaluate the vascular anatomy. Carotid stenosis measurements (when applicable) are obtained utilizing NASCET criteria, using the distal internal carotid diameter as the denominator. CONTRAST:  50mL ISOVUE-370 IOPAMIDOL (ISOVUE-370) INJECTION 76% COMPARISON:  Brain MRI 03/25/2018. Chest CT 02/07/2018. No prior angiographic imaging. FINDINGS: CTA NECK FINDINGS Aortic arch: Standard 3 vessel aortic arch with mild  atherosclerotic plaque. Widely patent arch vessel origins. Tortuous brachiocephalic and left subclavian arteries. Right carotid system: Patent without evidence of stenosis or dissection. Mild calcified plaque at the carotid bifurcation. Left carotid system: Patent without evidence of stenosis or dissection. Mild calcified plaque at the carotid bifurcation. Vertebral arteries: Patent and codominant without evidence of stenosis or dissection. Skeleton: Indeterminate 4 mm sclerotic focus in the right mandibular condyle. Moderate cervical disc and facet degeneration. Other neck: No evidence of acute abnormality or mass. Upper chest: Right subclavian Port-A-Cath. Asymmetric linear and patchy opacities in the left upper lobe, new from the prior chest CT. Review of the MIP images confirms the above findings CTA HEAD FINDINGS Anterior circulation: The internal carotid arteries are patent from skull base to carotid termini with mild nonstenotic siphon atherosclerosis. ACAs and MCAs are patent without evidence of proximal branch occlusion or significant proximal stenosis. No aneurysm is identified. Posterior circulation: The intracranial vertebral arteries are widely patent to the basilar. Patent PICA and SCA origins are visualized bilaterally. The basilar artery is widely patent. Medium sized right and diminutive left posterior communicating arteries are noted. Both PCAs are patent without evidence of significant stenosis. No aneurysm is identified. Venous sinuses: Not evaluated due to arterial phase contrast timing. Anatomic variants: None. Review of the MIP images confirms the above findings IMPRESSION: 1. No emergent large vessel occlusion. 2. Mild intracranial and cervical atherosclerosis without significant stenosis. 3.  Aortic Atherosclerosis (ICD10-I70.0). 4. Patchy left upper lobe pulmonary opacities, new from 02/2018. Correlate for pulmonary symptoms and consider chest radiographs for further evaluation. These  results were communicated to Dr. Wilford Corner at 3:20 pm on 04/27/2018 by text page via the [facility] messaging system. Electronically Signed   By: Sebastian Ache M.D.   On: 04/27/2018 16:00   Dg Chest 2 View  Result Date: 04/27/2018 CLINICAL DATA:  Nausea. EXAM: CHEST - 2 VIEW COMPARISON:  02/07/2018 FINDINGS: Port in the anterior chest wall with tip in distal SVC. Low lung volumes. Normal cardiac silhouette mild central venous congestion. Apical capping on the LEFT could indicate pleural fluid. IMPRESSION: 1. Low lung volumes. 2. Central venous congestion. 3. Potential pleural fluid at the LEFT lung apex. Electronically Signed   By: Genevive Bi M.D.   On: 04/27/2018 17:48   Ct  Angio Neck W Or Wo Contrast  Result Date: 04/27/2018 CLINICAL DATA:  Altered mental status. Syncopal episode. Dysarthria and generalized weakness. History of breast cancer. EXAM: CT ANGIOGRAPHY HEAD AND NECK TECHNIQUE: Multidetector CT imaging of the head and neck was performed using the standard protocol during bolus administration of intravenous contrast. Multiplanar CT image reconstructions and MIPs were obtained to evaluate the vascular anatomy. Carotid stenosis measurements (when applicable) are obtained utilizing NASCET criteria, using the distal internal carotid diameter as the denominator. CONTRAST:  50mL ISOVUE-370 IOPAMIDOL (ISOVUE-370) INJECTION 76% COMPARISON:  Brain MRI 03/25/2018. Chest CT 02/07/2018. No prior angiographic imaging. FINDINGS: CTA NECK FINDINGS Aortic arch: Standard 3 vessel aortic arch with mild atherosclerotic plaque. Widely patent arch vessel origins. Tortuous brachiocephalic and left subclavian arteries. Right carotid system: Patent without evidence of stenosis or dissection. Mild calcified plaque at the carotid bifurcation. Left carotid system: Patent without evidence of stenosis or dissection. Mild calcified plaque at the carotid bifurcation. Vertebral arteries: Patent and codominant without evidence of  stenosis or dissection. Skeleton: Indeterminate 4 mm sclerotic focus in the right mandibular condyle. Moderate cervical disc and facet degeneration. Other neck: No evidence of acute abnormality or mass. Upper chest: Right subclavian Port-A-Cath. Asymmetric linear and patchy opacities in the left upper lobe, new from the prior chest CT. Review of the MIP images confirms the above findings CTA HEAD FINDINGS Anterior circulation: The internal carotid arteries are patent from skull base to carotid termini with mild nonstenotic siphon atherosclerosis. ACAs and MCAs are patent without evidence of proximal branch occlusion or significant proximal stenosis. No aneurysm is identified. Posterior circulation: The intracranial vertebral arteries are widely patent to the basilar. Patent PICA and SCA origins are visualized bilaterally. The basilar artery is widely patent. Medium sized right and diminutive left posterior communicating arteries are noted. Both PCAs are patent without evidence of significant stenosis. No aneurysm is identified. Venous sinuses: Not evaluated due to arterial phase contrast timing. Anatomic variants: None. Review of the MIP images confirms the above findings IMPRESSION: 1. No emergent large vessel occlusion. 2. Mild intracranial and cervical atherosclerosis without significant stenosis. 3.  Aortic Atherosclerosis (ICD10-I70.0). 4. Patchy left upper lobe pulmonary opacities, new from 02/2018. Correlate for pulmonary symptoms and consider chest radiographs for further evaluation. These results were communicated to Dr. Wilford Corner at 3:20 pm on 04/27/2018 by text page via the [facility] messaging system. Electronically Signed   By: Sebastian Ache M.D.   On: 04/27/2018 16:00   Ct Head Code Stroke Wo Contrast  Addendum Date: 04/27/2018   ADDENDUM REPORT: 04/27/2018 16:21 ADDENDUM: The patient has multiple accounts in [facility] which have now been merged. 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 Electronically Signed   By: Sebastian Ache M.D.   On: 04/27/2018 16:21   Result Date: 04/27/2018 CLINICAL DATA:  Code stroke. Altered mental status. Syncopal episode. Dysarthria and generalized weakness. EXAM: CT HEAD WITHOUT CONTRAST TECHNIQUE: Contiguous axial images were obtained from the base of the skull through the vertex without intravenous contrast. COMPARISON:  None. FINDINGS: The study is mildly motion degraded. Brain: No acute large territory infarct, intracranial hemorrhage, mass, midline shift, or extra-axial fluid collection is identified. Patchy bilateral cerebral white matter hypodensities are non-specific but compatible with moderate chronic small vessel ischemic disease. There is a small age indeterminate infarct involving the anterior left basal ganglia/internal  capsule. There is mild cerebral atrophy. Vascular: Calcified atherosclerosis at the skull base. No hyperdense vessel. Skull: No fracture or focal osseous lesion. Sinuses/Orbits: Visualized paranasal sinuses and mastoid air cells are clear. Orbits are unremarkable. Other: None. ASPECTS [facility] Stroke Program Early CT Score) Not scored given the presenting symptoms. IMPRESSION: 1. No intracranial hemorrhage or evidence of acute large territory infarct. 2. Moderate chronic small vessel ischemic disease with age indeterminate infarct in the anterior left basal ganglia. These results were communicated to Dr. Wilford Corner at 3:20 pm on 04/27/2018 by text page via the [facility] messaging system. Electronically Signed: By: Sebastian Ache M.D. On: 04/27/2018 15:20    Procedures Procedures (including critical care time)  Medications Ordered in ED Medications  sodium chloride 0.9 % bolus 1,000 mL (0 mLs Intravenous Stopped 04/27/18 1822)    Followed by  0.9 %  sodium chloride infusion (1,000 mLs Intravenous New  Bag/Given 04/27/18 1821)  iopamidol (ISOVUE-370) 76 % injection (50 mLs  Contrast Given 04/27/18 1515)  acetaminophen (TYLENOL) tablet 650 mg (650 mg Oral Given 04/27/18 1812)     Initial Impression / Assessment and Plan / ED Course  I have reviewed the triage vital signs and the nursing notes.  Pertinent labs & imaging results that were available during my care of the patient were reviewed by me and considered in my medical decision making (see chart for details).     MDM  Code stroke called,  Pt to Ct scan.  Pt is improving after returning.  Neurology Dr. Wilford Corner saw pt and ordered Cta of head and neck.  Pt has had continued improvement.  Pt is able to talk.  She reports she feels back to normal.   Dr. Wilford Corner advised he feels medication if the cause of episode.  Pt is advised to hold Ibrance and call Oncology on Monday to discuss.  Pt's daughter reports pt has returned to normal.  Pt is comfortable going home.  Family will be with pt and will return if any problems.   Final Clinical Impressions(s) / ED Diagnoses   Final diagnoses:  Medication adverse effect, initial encounter  Transient alteration of awareness    ED Discharge Orders    None    An After Visit Summary was printed and given to the patient.    Osie Cheeks 04/27/18 2100    Arby Barrette, MD 04/28/18 2259

## 2018-04-27 NOTE — Consult Note (Addendum)
Neurology Consultation Reason for Consult: code stroke Referring Physician: Dr Donnald Garre  CC: lethargy  History is obtained from: patient, daughter, chart  HPI: Alejandra Hughes is a 67 y.o. female DM, HTN, breast cancer with a recent introduction of Ibrance to her treatment 2 days ago which has made her very lethargic, who presented to ER when she had a sudden onset of what she describes as drowsiness and dizziness along with nausea at a car dealership where she was with her daughter. She has started a new medication Ibrance 2 days ago.  The daughter reports she is been extremely lethargic after starting these medication.  She reports that she forgot to take her medication last night and took it this morning.  They were outside and that is when these presenting symptoms happened as above. She denied looking at the patient in seeing any focal weakness.  She did report that her speech had become garbled at the time and there was questionable facial "twisting" but she could not tell me what side the face might of twisted to. Upon initial assessment in the emergency room, see detailed exam below, the exam is more of encephalopathy and not of stroke.  Imaging was pursued as below.. She also reportedly had a syncopal episode.  LKW: 2:15 PM on 04/27/2018 tpa given?: no, nonfocal exam, low stroke scale Premorbid modified Rankin scale (mRS): 2  ROS: ROS was performed and is negative except as noted in the HPI.   Past Medical History:  Diagnosis Date  . Arthritis   . Asthma   . Diabetes mellitus   . Hypertension    No family history on file.   Social History:   reports that she has never smoked. She has never used smokeless tobacco. She reports that she does not drink alcohol or use drugs.  Medications No current facility-administered medications for this encounter.   Current Outpatient Medications:  .  albuterol (PROAIR HFA) 108 (90 Base) MCG/ACT inhaler, Inhale into the lungs., Disp: , Rfl:   .  amLODipine (NORVASC) 5 MG tablet, Take 5 mg by mouth daily., Disp: , Rfl:  .  benazepril-hydrochlorthiazide (LOTENSIN HCT) 10-12.5 MG tablet, Take by mouth., Disp: , Rfl:  .  Chlorpheniramine-APAP (CORICIDIN) 2-325 MG TABS, Take 2 tablets by mouth every 6 (six) hours as needed. For congestion, Disp: , Rfl:  .  DULoxetine (CYMBALTA) 60 MG capsule, Take 60 mg by mouth., Disp: , Rfl:  .  Exenatide ER (BYDUREON) 2 MG PEN, Inject 2 mg into the skin., Disp: , Rfl:  .  fluticasone (FLONASE) 50 MCG/ACT nasal spray, Place into the nose., Disp: , Rfl:  .  loratadine (CLARITIN) 10 MG tablet, Take by mouth., Disp: , Rfl:  .  metFORMIN (GLUCOPHAGE) 1000 MG tablet, Take 1,000 mg by mouth., Disp: , Rfl:  .  metoprolol succinate (TOPROL-XL) 50 MG 24 hr tablet, Take 50 mg by mouth., Disp: , Rfl:  .  nabumetone (RELAFEN) 500 MG tablet, Take 500 mg by mouth., Disp: , Rfl:  .  omeprazole (PRILOSEC) 40 MG capsule, Take 40 mg by mouth., Disp: , Rfl:  .  pioglitazone (ACTOS) 15 MG tablet, Take 15 mg by mouth., Disp: , Rfl:  .  rosuvastatin (CRESTOR) 20 MG tablet, Take 20 mg by mouth., Disp: , Rfl:  .  triamterene-hydrochlorothiazide (MAXZIDE) 75-50 MG per tablet, Take 1 tablet by mouth daily., Disp: , Rfl:   Exam: Current vital signs: BP (!) 146/99   Pulse 91   Resp 19  Ht 5' 4.5" (1.638 m)   Wt 99.8 kg (220 lb)   SpO2 100%   BMI 37.18 kg/m  Vital signs in last 24 hours: Pulse Rate:  [87-96] 91 (07/20 1702) Resp:  [17-23] 19 (07/20 1702) BP: (113-146)/(57-99) 146/99 (07/20 1702) SpO2:  [96 %-100 %] 100 % (07/20 1702) Weight:  [99.8 kg (220 lb)] 99.8 kg (220 lb) (07/20 1541) GENERAL: Awake, alert in NAD HEENT: - Normocephalic and atraumatic, dry mm, no LN++, no Thyromegally LUNGS - Clear to auscultation bilaterally with no wheezes CV - S1S2 RRR, no m/r/g, equal pulses bilaterally. ABDOMEN - Soft, nontender, nondistended with normoactive BS Ext: warm, well perfused, intact peripheral pulses, no  eedema NEURO:  Mental Status: Awake, alert, oriented to self and place. Reduced attention and concentration. Bradyphrenia noted. Language: speech is clear.  Naming slow and intermittently impaired, but repetition, fluency, and comprehension intact. Cranial Nerves: PERRL. EOMI, visual fields full, no facial asymmetry, facial sensation intact, hearing intact, tongue/uvula/soft palate midline, normal sternocleidomastoid and trapezius muscle strength. No evidence of tongue atrophy or fibrillations Motor: Symmetric 4+/5 all over with poor attention she drifts all 4 and requires constant encouragement to keep limbs up Tone: is normal and bulk is normal Sensation- Intact to light touch bilaterally Coordination: FTN intact bilaterally, no ataxia in BLE. Gait- deferred  NIHSS 1a Level of Conscious.: 0 1b LOC Questions: 1 1c LOC Commands: 0 2 Best Gaze: 0 3 Visual: 0 4 Facial Palsy: 0 5a Motor Arm - left: 0 5b Motor Arm - Right: 0 6a Motor Leg - Left: 0 6b Motor Leg - Right: 0 7 Limb Ataxia: 0 8 Sensory: 0 9 Best Language: 1 10 Dysarthria: 0 11 Extinct. and Inatten.: 0 TOTAL: 2  Labs I have reviewed labs in epic and the results pertinent to this consultation are:  CBC    Component Value Date/Time   WBC 4.2 04/27/2018 1451   RBC 3.96 04/27/2018 1451   HGB 12.2 04/27/2018 1459   HCT 36.0 04/27/2018 1459   PLT 268 04/27/2018 1451   MCV 93.7 04/27/2018 1451   MCH 29.0 04/27/2018 1451   MCHC 31.0 04/27/2018 1451   RDW 16.1 (H) 04/27/2018 1451   LYMPHSABS 1.1 04/27/2018 1451   MONOABS 0.4 04/27/2018 1451   EOSABS 0.1 04/27/2018 1451   BASOSABS 0.0 04/27/2018 1451    CMP     Component Value Date/Time   NA 143 04/27/2018 1459   K 4.1 04/27/2018 1459   CL 107 04/27/2018 1459   CO2 23 04/27/2018 1451   GLUCOSE 130 (H) 04/27/2018 1459   BUN 23 04/27/2018 1459   CREATININE 1.60 (H) 04/27/2018 1459   CALCIUM 9.5 04/27/2018 1451   PROT 7.0 04/27/2018 1451   ALBUMIN 4.1  04/27/2018 1451   AST 28 04/27/2018 1451   ALT 11 04/27/2018 1451   ALKPHOS 47 04/27/2018 1451   BILITOT 1.0 04/27/2018 1451   GFRNONAA 30 (L) 04/27/2018 1451   GFRAA 34 (L) 04/27/2018 1451   Imaging I have reviewed the images obtained: CT-scan of the brain- 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 No acute chages CTA head neck with no emergent LVO.  Assessment:  67 year old woman history of diabetes, hypertension breast cancer with recent start of Ibrance to her treatment 2 days ago, presenting for evaluation of a syncopal episode followed by generalized weakness and lethargy. Initially a code stroke was paged. Exam was nonfocal with only encephalopathy on exam. I suspect that this is a syncopal episode secondary to the new  medication versus a cardia genic syncopal episode. The medication Ilda Foilbrance is associated with fatigue, weakness and asthenia and the family has noted a significant amount of it ever since she started it a week ago. At this time, because of nonfocal exam her symptoms improving, I would not recommend further neurological work-up. I have recommended that she be observed in the ER, given some IV fluids, labs checked and if everything looks okay and she returns back to baseline, can be discharged home with outpatient follow-up with her oncologist.  Impression: Toxic metabolic encephalopathy in the setting of a new medication. Questionable syncopal episode Low probability of stroke-CT head and CT angios head and neck unremarkable.  Recommendations: Check basic labs Check UA IV fluids for hydration If patient back to baseline after some observation in the ER, can return back home with follow-up with outpatient oncologist. Do not see further need for neurological work-up Neurology services will be available as needed.  Please call with questions.    -- Milon DikesAshish Nazli Penn, MD [facility] Pager: [phone] If 7pm to 7am, please call on call as listed on AMION.

## 2018-04-27 NOTE — Discharge Instructions (Signed)
Call your Oncologist on Monday.   Do not take IBrance until discussing with your Physician

## 2018-04-27 NOTE — ED Notes (Signed)
Patient transported to X-ray 

## 2018-07-09 MED ORDER — GLUCOSE 40 % PO GEL
15.00 | ORAL | Status: DC
Start: ? — End: 2018-07-09

## 2018-07-09 MED ORDER — HYDRALAZINE HCL 20 MG/ML IJ SOLN
10.00 | INTRAMUSCULAR | Status: DC
Start: ? — End: 2018-07-09

## 2018-07-09 MED ORDER — ASPIRIN EC 81 MG PO TBEC
81.00 | DELAYED_RELEASE_TABLET | ORAL | Status: DC
Start: 2018-07-09 — End: 2018-07-09

## 2018-07-09 MED ORDER — DULOXETINE HCL 30 MG PO CPEP
60.00 | ORAL_CAPSULE | ORAL | Status: DC
Start: 2018-07-09 — End: 2018-07-09

## 2018-07-09 MED ORDER — SODIUM CHLORIDE 0.9 % IV SOLN
INTRAVENOUS | Status: DC
Start: ? — End: 2018-07-09

## 2018-07-09 MED ORDER — ALBUTEROL SULFATE 1.25 MG/3ML IN NEBU
1.25 | INHALATION_SOLUTION | RESPIRATORY_TRACT | Status: DC
Start: ? — End: 2018-07-09

## 2018-07-09 MED ORDER — GENERIC EXTERNAL MEDICATION
100.00 | Status: DC
Start: ? — End: 2018-07-09

## 2018-07-09 MED ORDER — PANTOPRAZOLE SODIUM 40 MG PO TBEC
40.00 | DELAYED_RELEASE_TABLET | ORAL | Status: DC
Start: 2018-07-09 — End: 2018-07-09

## 2018-07-09 MED ORDER — DOCUSATE SODIUM 100 MG PO CAPS
100.00 | ORAL_CAPSULE | ORAL | Status: DC
Start: ? — End: 2018-07-09

## 2018-07-09 MED ORDER — GENERIC EXTERNAL MEDICATION
1.00 | Status: DC
Start: ? — End: 2018-07-09

## 2018-07-09 MED ORDER — ONDANSETRON HCL 4 MG/2ML IJ SOLN
4.00 | INTRAMUSCULAR | Status: DC
Start: ? — End: 2018-07-09

## 2018-07-09 MED ORDER — ACETAMINOPHEN 500 MG PO TABS
500.00 | ORAL_TABLET | ORAL | Status: DC
Start: ? — End: 2018-07-09

## 2018-07-09 MED ORDER — HEPARIN SODIUM (PORCINE) 5000 UNIT/ML IJ SOLN
5000.00 | INTRAMUSCULAR | Status: DC
Start: 2018-07-08 — End: 2018-07-09

## 2018-07-09 MED ORDER — BACLOFEN 10 MG PO TABS
10.00 | ORAL_TABLET | ORAL | Status: DC
Start: ? — End: 2018-07-09

## 2018-07-09 MED ORDER — METOPROLOL SUCCINATE ER 50 MG PO TB24
100.00 | ORAL_TABLET | ORAL | Status: DC
Start: 2018-07-08 — End: 2018-07-09

## 2018-07-09 MED ORDER — DOCUSATE SODIUM 100 MG PO CAPS
100.00 | ORAL_CAPSULE | ORAL | Status: DC
Start: 2018-07-08 — End: 2018-07-09

## 2018-07-09 MED ORDER — INSULIN LISPRO 100 UNIT/ML ~~LOC~~ SOLN
2.00 | SUBCUTANEOUS | Status: DC
Start: 2018-07-09 — End: 2018-07-09

## 2018-07-09 MED ORDER — DEXTROSE 10 % IV SOLN
125.00 | INTRAVENOUS | Status: DC
Start: ? — End: 2018-07-09

## 2018-07-09 MED ORDER — ATORVASTATIN CALCIUM 40 MG PO TABS
80.00 | ORAL_TABLET | ORAL | Status: DC
Start: 2018-07-09 — End: 2018-07-09

## 2018-07-09 MED ORDER — SODIUM CHLORIDE FLUSH 0.9 % IV SOLN
5.00 | INTRAVENOUS | Status: DC
Start: 2018-07-08 — End: 2018-07-09

## 2018-07-09 MED ORDER — ANASTROZOLE 1 MG PO TABS
1.00 | ORAL_TABLET | ORAL | Status: DC
Start: 2018-07-09 — End: 2018-07-09

## 2018-07-09 MED ORDER — MONTELUKAST SODIUM 10 MG PO TABS
10.00 | ORAL_TABLET | ORAL | Status: DC
Start: 2018-07-08 — End: 2018-07-09

## 2018-07-09 MED ORDER — SODIUM CHLORIDE FLUSH 0.9 % IV SOLN
5.00 | INTRAVENOUS | Status: DC
Start: ? — End: 2018-07-09

## 2018-07-09 MED ORDER — FAMOTIDINE 20 MG PO TABS
40.00 | ORAL_TABLET | ORAL | Status: DC
Start: 2018-07-08 — End: 2018-07-09

## 2018-07-09 MED ORDER — IPRATROPIUM BROMIDE 0.02 % IN SOLN
0.50 | RESPIRATORY_TRACT | Status: DC
Start: ? — End: 2018-07-09

## 2018-07-09 MED ORDER — BISACODYL 5 MG PO TBEC
10.00 | DELAYED_RELEASE_TABLET | ORAL | Status: DC
Start: ? — End: 2018-07-09

## 2019-04-29 IMAGING — CT CT ANGIO NECK
2 of 7 series · 8 of 33 positions shown · IV contrast (OMNI 350)
Comparison: Brain MRI 03/25/2018. Chest CT 02/07/2018. No prior
angiographic imaging.

CLINICAL DATA: Altered mental status. Syncopal episode. Dysarthria
and generalized weakness. History of breast cancer.

EXAM:
CT ANGIOGRAPHY HEAD AND NECK
TECHNIQUE: Multidetector CT imaging of the head and neck was performed using
the standard protocol during bolus administration of intravenous
contrast. Multiplanar CT image reconstructions and MIPs were
obtained to evaluate the vascular anatomy. Carotid stenosis
measurements (when applicable) are obtained utilizing NASCET
criteria, using the distal internal carotid diameter as the
denominator.
CONTRAST:  50mL BE7A4C-33Y IOPAMIDOL (BE7A4C-33Y) INJECTION 76%

[Series 5: cta neck · axial · 0.49mm/px · z∈[-180,-70]mm · 2 of 167 slices shown]
[im 56/167  soft-tissue]
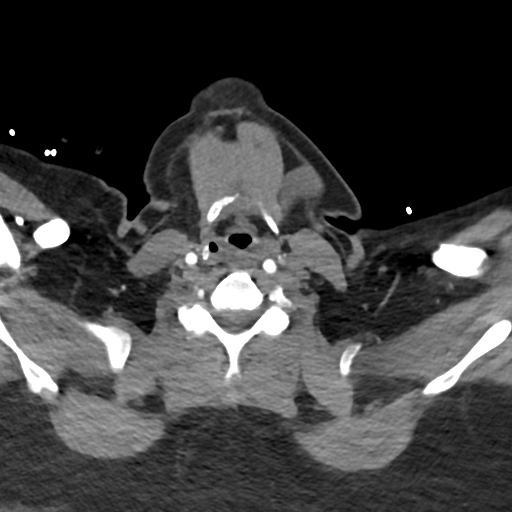
[im 111/167  soft-tissue]
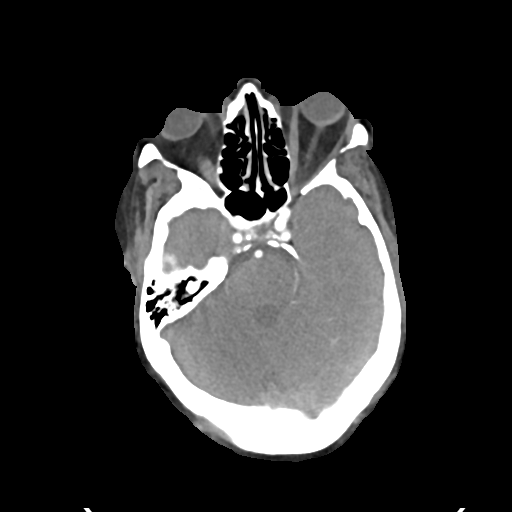

[Series 7: cta neck axial · axial · 0.44mm/px · z∈[-250,-15]mm · 6 of 332 slices shown]
[im 48/332  soft-tissue]
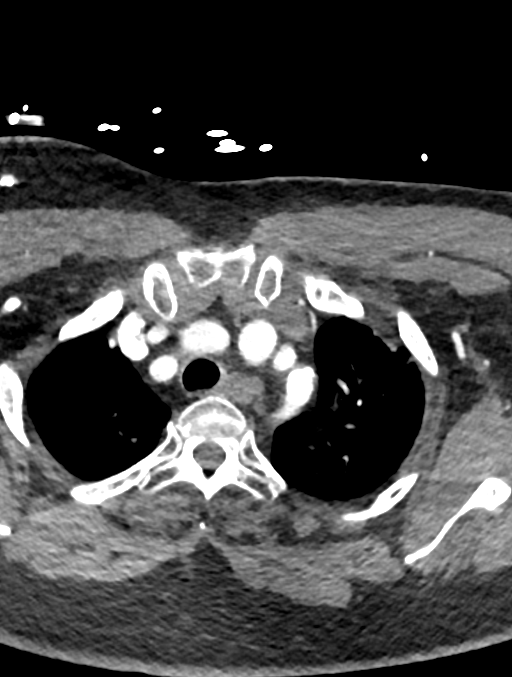
[im 95/332  bone]
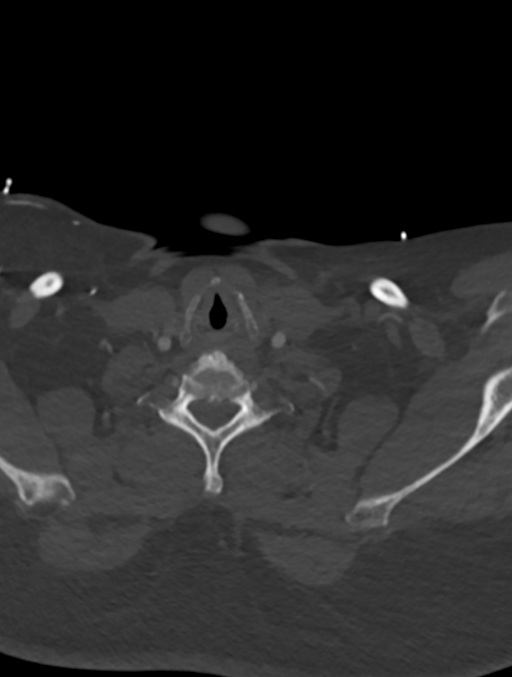
[im 142/332  soft-tissue]
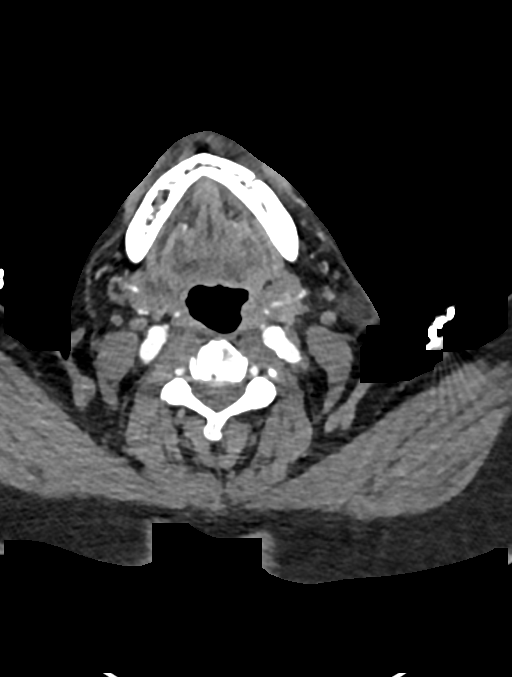
[im 190/332  bone]
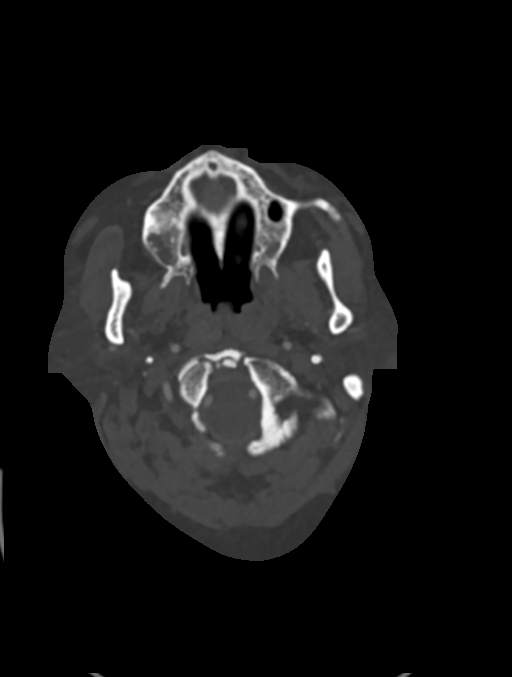
[im 237/332  soft-tissue]
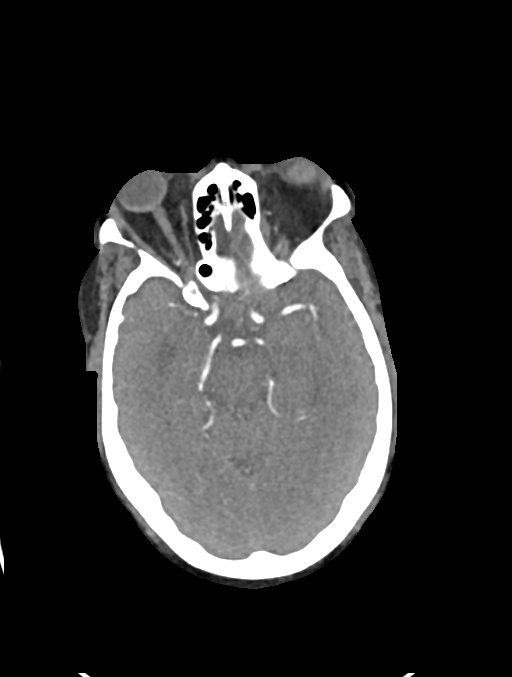
[im 284/332  bone]
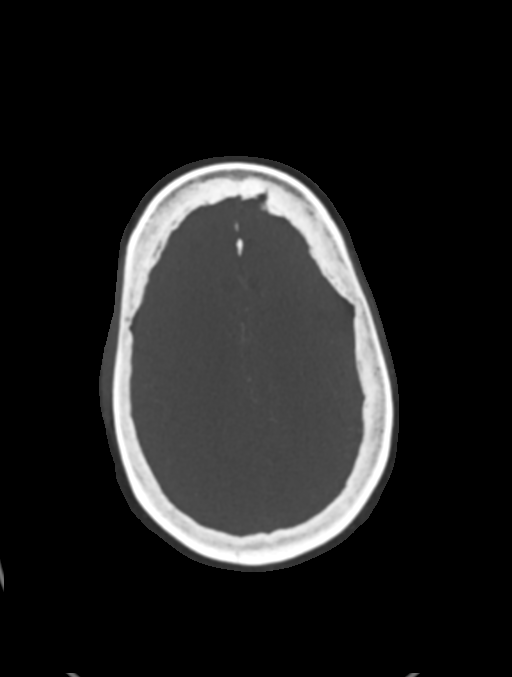

[8 of 33 positions shown; findings below may reference images not displayed]

FINDINGS: CTA NECK FINDINGS

Aortic arch: Standard 3 vessel aortic arch with mild atherosclerotic
plaque. Widely patent arch vessel origins. Tortuous brachiocephalic
and left subclavian arteries.

Right carotid system: Patent without evidence of stenosis or
dissection. Mild calcified plaque at the carotid bifurcation.

Left carotid system: Patent without evidence of stenosis or
dissection. Mild calcified plaque at the carotid bifurcation.

Vertebral arteries: Patent and codominant without evidence of
stenosis or dissection.

Skeleton: Indeterminate 4 mm sclerotic focus in the right mandibular
condyle. Moderate cervical disc and facet degeneration.

Other neck: No evidence of acute abnormality or mass.

Upper chest: Right subclavian Port-A-Cath. Asymmetric linear and
patchy opacities in the left upper lobe, new from the prior chest
CT.

Review of the MIP images confirms the above findings

CTA HEAD FINDINGS

Anterior circulation: The internal carotid arteries are patent from
skull base to carotid termini with mild nonstenotic siphon
atherosclerosis. ACAs and MCAs are patent without evidence of
proximal branch occlusion or significant proximal stenosis. No
aneurysm is identified.

Posterior circulation: The intracranial vertebral arteries are
widely patent to the basilar. Patent PICA and SCA origins are
visualized bilaterally. The basilar artery is widely patent. Medium
sized right and diminutive left posterior communicating arteries are
noted. Both PCAs are patent without evidence of significant
stenosis. No aneurysm is identified.

Venous sinuses: Not evaluated due to arterial phase contrast timing.

Anatomic variants: None.

Review of the MIP images confirms the above findings
IMPRESSION: 1. No emergent large vessel occlusion.
2. Mild intracranial and cervical atherosclerosis without
significant stenosis.
3.  Aortic Atherosclerosis ([id number]).
4. Patchy left upper lobe pulmonary opacities, new from [DATE].
Correlate for pulmonary symptoms and consider chest radiographs for
further evaluation.

These results were communicated to Dr. Per Espen at [DATE] on 04/27/2018
by text page via the AMION messaging system.

## 2022-09-08 DEATH — deceased
